# Patient Record
Sex: Female | Born: 1975 | Race: Black or African American | Hispanic: No | Marital: Single | Smoking: Current some day smoker
Health system: Southern US, Community
[De-identification: ages and names within clinical notes are randomized; demographics above are authoritative.]

## PROBLEM LIST (undated history)

## (undated) DIAGNOSIS — T7421XA Adult sexual abuse, confirmed, initial encounter: Secondary | ICD-10-CM

## (undated) HISTORY — DX: Adult sexual abuse, confirmed, initial encounter: T74.21XA

## (undated) HISTORY — PX: MOUTH SURGERY: SHX715

## (undated) HISTORY — PX: COSMETIC SURGERY: SHX468

---

## 2013-06-01 ENCOUNTER — Emergency Department (HOSPITAL_COMMUNITY): Payer: Self-pay

## 2013-06-01 ENCOUNTER — Emergency Department (HOSPITAL_COMMUNITY)
Admission: EM | Admit: 2013-06-01 | Discharge: 2013-06-01 | Disposition: A | Discharge status: Home / Self Care | Payer: Self-pay | Attending: Emergency Medicine | Admitting: Emergency Medicine

## 2013-06-01 ENCOUNTER — Encounter (HOSPITAL_COMMUNITY): Payer: Self-pay | Admitting: *Deleted

## 2013-06-01 DIAGNOSIS — F17200 Nicotine dependence, unspecified, uncomplicated: Secondary | ICD-10-CM | POA: Insufficient documentation

## 2013-06-01 DIAGNOSIS — K047 Periapical abscess without sinus: Secondary | ICD-10-CM | POA: Insufficient documentation

## 2013-06-01 LAB — BASIC METABOLIC PANEL
CO2: 21 mEq/L (ref 19–32)
Calcium: 9 mg/dL (ref 8.4–10.5)
GFR calc Af Amer: >90 mL/min (ref >90)
Sodium: 134 mEq/L — ABNORMAL LOW (ref 135–145)

## 2013-06-01 LAB — CBC WITH DIFFERENTIAL/PLATELET
Basophils Absolute: 0 10*3/uL (ref 0.0–0.1)
Eosinophils Relative: 1 % (ref 0–5)
Lymphocytes Relative: 42 % (ref 12–46)
MCV: 83.3 fL (ref 78.0–100.0)
Neutro Abs: 4.1 10*3/uL (ref 1.7–7.7)
Platelets: 342 10*3/uL (ref 150–400)
RDW: 14.6 % (ref 11.5–15.5)
WBC: 8.4 10*3/uL (ref 4.0–10.5)

## 2013-06-01 MED ORDER — OXYCODONE-ACETAMINOPHEN 5-325 MG PO TABS: 2.0000 | ORAL_TABLET | Freq: Four times a day (QID) | ORAL | Status: DC | PRN

## 2013-06-01 MED ORDER — MORPHINE SULFATE 4 MG/ML IJ SOLN
4.0000 mg | Freq: Once | INTRAMUSCULAR | Status: AC
  Administered 2013-06-01: 4 mg via INTRAVENOUS
  Filled 2013-06-01: qty 1

## 2013-06-01 MED ORDER — AMOXICILLIN 500 MG PO CAPS: 500.0000 mg | ORAL_CAPSULE | Freq: Three times a day (TID) | ORAL | Status: DC

## 2013-06-01 MED ORDER — IOHEXOL 300 MG/ML  SOLN
75.0000 mL | Freq: Once | INTRAMUSCULAR | Status: AC | PRN
  Administered 2013-06-01: 75 mL via INTRAVENOUS

## 2013-06-01 MED ORDER — CLINDAMYCIN HCL 300 MG PO CAPS
300.0000 mg | ORAL_CAPSULE | Freq: Once | ORAL | Status: AC
  Administered 2013-06-01: 300 mg via ORAL
  Filled 2013-06-01: qty 1

## 2013-06-01 NOTE — ED Notes (Signed)
Pt states pain controlled

## 2013-06-01 NOTE — ED Notes (Signed)
Patient states right sided facial swelling x 2 days, pt states started as tooth ache

## 2013-06-01 NOTE — ED Notes (Signed)
Roxy Horseman, PA and Dr Rulon Abide at bedside

## 2013-06-01 NOTE — ED Notes (Signed)
PATIENT HAS ARRIVED TO WAIT FOR ORAL SURGEON

## 2013-06-01 NOTE — ED Provider Notes (Addendum)
History    CSN: [id number] Arrival date & time 06/01/13  0615  First MD Initiated Contact with Patient 06/01/13 [phone]     Chief Complaint  Patient presents with  . Oral Swelling   (Consider location/radiation/quality/duration/timing/severity/associated sxs/prior Treatment) HPI Comments: Patient presents emergency department with chief complaint of jaw pain and right-sided facial swelling. States that she is toothache for the past 2 weeks, but really noticed the jaw swelling yesterday. She is tried taking Aleve with no relief. She denies fevers, chills, nausea, vomiting. She denies any difficulty breathing, or difficulty swallowing. Nothing makes the symptoms better or worse. She does not have a dentist.  The history is provided by the patient. No language interpreter was used.   History reviewed. No pertinent past medical history. History reviewed. No pertinent past surgical history. No family history on file. History  Substance Use Topics  . Smoking status: Current Every Day Smoker  . Smokeless tobacco: Not on file  . Alcohol Use: No   OB History   Grav Para Term Preterm Abortions TAB SAB Ect Mult Living                 Review of Systems  All other systems reviewed and are negative.    Allergies  Review of patient's allergies indicates not on file.  Home Medications  No current outpatient prescriptions on file. BP 130/83  Temp(Src) 98.1 F (36.7 C) (Oral)  Resp 18  SpO2 100%  LMP 05/03/2013 Physical Exam  Nursing note and vitals reviewed. Constitutional: She is oriented to person, place, and time. She appears well-developed and well-nourished.  HENT:  Head: Normocephalic and atraumatic.  Mouth/Throat:    Poor dentition throughout.  Affected tooth as diagrammed.  No signs of peritonsillar or tonsillar abscess.  Oropharynx is clear and without exudates.  Uvula is midline.  Airway is intact. Question deep tissue swelling.  Eyes: Conjunctivae and EOM are normal.  Pupils are equal, round, and reactive to light.  Neck: Normal range of motion. Neck supple.  Cardiovascular: Normal rate and regular rhythm.  Exam reveals no gallop and no friction rub.   No murmur heard. Pulmonary/Chest: Effort normal and breath sounds normal. No respiratory distress. She has no wheezes. She has no rales. She exhibits no tenderness.  Abdominal: Soft. Bowel sounds are normal. She exhibits no distension and no mass. There is no tenderness. There is no rebound and no guarding.  Musculoskeletal: Normal range of motion. She exhibits no edema and no tenderness.  Neurological: She is alert and oriented to person, place, and time.  Skin: Skin is warm and dry.  Psychiatric: She has a normal mood and affect. Her behavior is normal. Judgment and thought content normal.    ED Course  Procedures (including critical care time) Labs Reviewed - No data to display Results for orders placed during the hospital encounter of 06/01/13  CBC WITH DIFFERENTIAL      Result Value Range   WBC 8.4  4.0 - 10.5 K/uL   RBC 4.12  3.87 - 5.11 MIL/uL   Hemoglobin 11.3 (*) 12.0 - 15.0 g/dL   HCT 57.8 (*) 46.9 - 62.9 %   MCV 83.3  78.0 - 100.0 fL   MCH 27.4  26.0 - 34.0 pg   MCHC 32.9  30.0 - 36.0 g/dL   RDW 52.8  41.3 - 24.4 %   Platelets 342  150 - 400 K/uL   Neutrophils Relative % 49  43 - 77 %   Neutro  Abs 4.1  1.7 - 7.7 K/uL   Lymphocytes Relative 42  12 - 46 %   Lymphs Abs 3.5  0.7 - 4.0 K/uL   Monocytes Relative 7  3 - 12 %   Monocytes Absolute 0.6  0.1 - 1.0 K/uL   Eosinophils Relative 1  0 - 5 %   Eosinophils Absolute 0.1  0.0 - 0.7 K/uL   Basophils Relative 0  0 - 1 %   Basophils Absolute 0.0  0.0 - 0.1 K/uL  BASIC METABOLIC PANEL      Result Value Range   Sodium 134 (*) 135 - 145 mEq/L   Potassium 3.9  3.5 - 5.1 mEq/L   Chloride 102  96 - 112 mEq/L   CO2 21  19 - 32 mEq/L   Glucose, Bld 106 (*) 70 - 99 mg/dL   BUN 11  6 - 23 mg/dL   Creatinine, Ser 1.61  0.50 - 1.10 mg/dL    Calcium 9.0  8.4 - 09.6 mg/dL   GFR calc non Af Amer >90  >90 mL/min   GFR calc Af Amer >90  >90 mL/min   Ct Soft Tissue Neck W Contrast  06/01/2013   *RADIOLOGY REPORT*  Clinical Data: Soft tissue swelling right maxilla and right submandibular region.  Toothache.  CT NECK WITH CONTRAST  Technique:  Multidetector CT imaging of the neck was performed with intravenous contrast.  Contrast: 75mL OMNIPAQUE IOHEXOL 300 MG/ML  SOLN  Comparison: None.  Findings: There is soft tissue swelling and infiltration of fat along the right body of the mandible. A small fluid collection is seen along the right side of the mandible measuring 0.9 cm AP by 0.3 cm transverse on image 62.  There is prominent periapical lucency and a small focus of cortical disruption in the periphery of the mandible about tooth number 29. A smaller periapical lucency is seen about tooth number 30.  Prominent periapical lucency is also seen about tooth number 17 on the left.  The patient has multiple cavities.  Major caliber vascular structures are patent.  No pathologic lymphadenopathy by CT size criteria is identified.  Imaged intracranial contents appear normal.  The lung apices are clear.  IMPRESSION:  1.   Peri apical abscesses about teeth number 29 and 30.  Changes appear worst about tooth number 29 where a small subperiosteal abscess is identified.  Associated cellulitis about the right side of the mandible is noted. 2.  Extensive dental disease.  Large periapical abscess is identified about tooth number 17.   Original Report Authenticated By: Holley Dexter, M.D.   INCISION AND DRAINAGE Performed by: Roxy Horseman Consent: Verbal consent obtained. Risks and benefits: risks, benefits and alternatives were discussed Type: abscess  Body area: gingiva  Anesthesia: local infiltration  Incision was made with a scalpel.  Local anesthetic: Bupivacaine  Anesthetic total: 2 ml  Complexity: complex Blunt dissection to break up  loculations  Drainage: purulent  Drainage amount: moderate  Packing material: not packed  Patient tolerance: Patient tolerated the procedure well with no immediate complications.     1. Dental abscess     MDM    6:42 AM Patient seen by and discussed with Dr. Rulon Abide.  Plan: CT maxillofacial and neck, based on the rapid nature of the infection, basic labs, clindamycin, I&D, and oral surgery consult.  9:23 AM Dr. Chales Salmon will be in surgery for several hours, and was unable to take my call.  CT shows multiple abscesses, but the patient's  airway is intact, and she does not have any evidence of Ludwig's.  Will move patient to pod c, and will wait for Dr. Chales Salmon to return my phone call.    12:03 PM Spoke with Dr. Chales Salmon via his nurse.  He recommends discharge with amoxicillin and pain control.  His office will see the patient tomorrow.  No airway compromise. No difficulty swallowing.  Patient is stable and ready for discharge.  Roxy Horseman, PA-C 06/01/13 1205  Roxy Horseman, PA-C 06/03/13 [phone]

## 2013-06-02 NOTE — ED Provider Notes (Signed)
Medical screening examination/treatment/procedure(s) were performed by non-physician practitioner and as supervising physician I was immediately available for consultation/collaboration.  Jones Skene, M.D.     Jones Skene, MD 06/02/13 [phone]

## 2013-06-04 NOTE — ED Provider Notes (Signed)
Medical screening examination/treatment/procedure(s) were performed by non-physician practitioner and as supervising physician I was immediately available for consultation/collaboration.  Jones Skene, M.D.     Jones Skene, MD 06/04/13 1610

## 2013-09-26 ENCOUNTER — Encounter (HOSPITAL_COMMUNITY): Payer: Self-pay | Admitting: Emergency Medicine

## 2013-09-26 ENCOUNTER — Emergency Department (HOSPITAL_COMMUNITY)
Admission: EM | Admit: 2013-09-26 | Discharge: 2013-09-26 | Discharge status: Against Medical Advice | Payer: Self-pay | Attending: Emergency Medicine | Admitting: Emergency Medicine

## 2013-09-26 DIAGNOSIS — R110 Nausea: Secondary | ICD-10-CM | POA: Insufficient documentation

## 2013-09-26 DIAGNOSIS — R109 Unspecified abdominal pain: Secondary | ICD-10-CM | POA: Insufficient documentation

## 2013-09-26 DIAGNOSIS — N6459 Other signs and symptoms in breast: Secondary | ICD-10-CM | POA: Insufficient documentation

## 2013-09-26 DIAGNOSIS — Z3202 Encounter for pregnancy test, result negative: Secondary | ICD-10-CM | POA: Insufficient documentation

## 2013-09-26 DIAGNOSIS — F17200 Nicotine dependence, unspecified, uncomplicated: Secondary | ICD-10-CM | POA: Insufficient documentation

## 2013-09-26 LAB — POCT PREGNANCY, URINE: Preg Test, Ur: NEGATIVE

## 2013-09-26 LAB — URINALYSIS, ROUTINE W REFLEX MICROSCOPIC
Glucose, UA: NEGATIVE mg/dL
Nitrite: NEGATIVE
Protein, ur: NEGATIVE mg/dL
Urobilinogen, UA: 1 mg/dL (ref 0.0–1.0)

## 2013-09-26 LAB — URINE MICROSCOPIC-ADD ON

## 2013-09-26 NOTE — ED Notes (Signed)
Called for pt with no answer 

## 2013-09-26 NOTE — ED Notes (Signed)
Pt called for with no answer x 1

## 2013-09-26 NOTE — ED Notes (Signed)
Called for pt with no answer x 2 

## 2013-09-26 NOTE — ED Notes (Signed)
Pt c/o lower abd pain with LMP beginning of October; pt sts white discharge from breasts and sts nausea

## 2013-09-29 ENCOUNTER — Encounter (HOSPITAL_COMMUNITY): Payer: Self-pay | Admitting: Emergency Medicine

## 2013-09-29 ENCOUNTER — Emergency Department (HOSPITAL_COMMUNITY)
Admission: EM | Admit: 2013-09-29 | Discharge: 2013-09-29 | Disposition: A | Discharge status: Home / Self Care | Payer: Self-pay | Attending: Emergency Medicine | Admitting: Emergency Medicine

## 2013-09-29 DIAGNOSIS — F17200 Nicotine dependence, unspecified, uncomplicated: Secondary | ICD-10-CM | POA: Insufficient documentation

## 2013-09-29 DIAGNOSIS — B9689 Other specified bacterial agents as the cause of diseases classified elsewhere: Secondary | ICD-10-CM | POA: Insufficient documentation

## 2013-09-29 DIAGNOSIS — N760 Acute vaginitis: Secondary | ICD-10-CM | POA: Insufficient documentation

## 2013-09-29 DIAGNOSIS — A499 Bacterial infection, unspecified: Secondary | ICD-10-CM | POA: Insufficient documentation

## 2013-09-29 DIAGNOSIS — N6459 Other signs and symptoms in breast: Secondary | ICD-10-CM | POA: Insufficient documentation

## 2013-09-29 DIAGNOSIS — Z3202 Encounter for pregnancy test, result negative: Secondary | ICD-10-CM | POA: Insufficient documentation

## 2013-09-29 LAB — COMPREHENSIVE METABOLIC PANEL
Albumin: 3.7 g/dL (ref 3.5–5.2)
BUN: 9 mg/dL (ref 6–23)
Creatinine, Ser: 0.87 mg/dL (ref 0.50–1.10)
GFR calc Af Amer: >90 mL/min (ref >90)
Glucose, Bld: 91 mg/dL (ref 70–99)
Total Bilirubin: 0.2 mg/dL — ABNORMAL LOW (ref 0.3–1.2)
Total Protein: 6.7 g/dL (ref 6.0–8.3)

## 2013-09-29 LAB — CBC WITH DIFFERENTIAL/PLATELET
Basophils Relative: 0 % (ref 0–1)
Eosinophils Absolute: 0.1 10*3/uL (ref 0.0–0.7)
Eosinophils Relative: 2 % (ref 0–5)
HCT: 38.9 % (ref 36.0–46.0)
Hemoglobin: 12.9 g/dL (ref 12.0–15.0)
Lymphs Abs: 2.7 10*3/uL (ref 0.7–4.0)
MCH: 28.6 pg (ref 26.0–34.0)
MCHC: 33.2 g/dL (ref 30.0–36.0)
MCV: 86.3 fL (ref 78.0–100.0)
Monocytes Absolute: 0.6 10*3/uL (ref 0.1–1.0)
Monocytes Relative: 9 % (ref 3–12)

## 2013-09-29 LAB — POCT PREGNANCY, URINE: Preg Test, Ur: NEGATIVE

## 2013-09-29 LAB — URINE MICROSCOPIC-ADD ON

## 2013-09-29 LAB — URINALYSIS, ROUTINE W REFLEX MICROSCOPIC
Glucose, UA: NEGATIVE mg/dL
Ketones, ur: 15 mg/dL — AB
Nitrite: NEGATIVE
Protein, ur: NEGATIVE mg/dL
pH: 7 (ref 5.0–8.0)

## 2013-09-29 LAB — WET PREP, GENITAL

## 2013-09-29 MED ORDER — METRONIDAZOLE 500 MG PO TABS: 500.0000 mg | ORAL_TABLET | Freq: Two times a day (BID) | ORAL | Status: DC

## 2013-09-29 NOTE — ED Notes (Signed)
Walked in to room to discharge patient. Pt. Was not in room or restroom.

## 2013-09-29 NOTE — ED Notes (Signed)
Pt. Left prior to receiving discharge paperwork.

## 2013-09-29 NOTE — ED Notes (Signed)
Pt reports lower abd pain and cramping x 1.5 week. Having nausea, denies any urinary or vaginal symptoms. Denies vomiting or diarrhea. Did have irregular period last month that only lasted approx 1 day. Also having a discharge from right breast.

## 2013-09-29 NOTE — ED Provider Notes (Signed)
CSN: [id number]     Arrival date & time 09/29/13  1650 History   First MD Initiated Contact with Patient 09/29/13 1959     Chief Complaint  Patient presents with  . Abdominal Pain  . Breast Discharge   (Consider location/radiation/quality/duration/timing/severity/associated sxs/prior Treatment) Patient is a 37 y.o. female presenting with abdominal pain. The history is provided by the patient.  Abdominal Pain  patient complains of bilateral ovarian pain x1.5 weeks. Pain characterized as sharp and lasting for seconds. No associated vaginal bleeding or discharge. Has had some clear discharge from her right nipple without surrounding redness or drainage. No dysuria or hematuria. Denies any fever or chills. Nothing makes her symptoms better worse and no treatment used prior to arrival. Last menstrual period Was in October  History reviewed. No pertinent past medical history. History reviewed. No pertinent past surgical history. History reviewed. No pertinent family history. History  Substance Use Topics  . Smoking status: Current Every Day Smoker  . Smokeless tobacco: Not on file  . Alcohol Use: No   OB History   Grav Para Term Preterm Abortions TAB SAB Ect Mult Living                 Review of Systems  Gastrointestinal: Positive for abdominal pain.  All other systems reviewed and are negative.    Allergies  Review of patient's allergies indicates no known allergies.  Home Medications   Current Outpatient Rx  Name  Route  Sig  Dispense  Refill  . acetaminophen (TYLENOL) 325 MG suppository   Rectal   Place 325 mg rectally every 4 (four) hours as needed for fever.         . naproxen sodium (ANAPROX) 220 MG tablet   Oral   Take 220 mg by mouth 2 (two) times daily as needed.          BP 122/64  Pulse 77  Temp(Src) 98.4 F (36.9 C) (Oral)  Resp 18  Ht 5\' 7"  (1.702 m)  Wt 217 lb 11.2 oz (98.748 kg)  BMI 34.09 kg/m2  SpO2 99%  LMP 08/10/2013 Physical Exam  Nursing  note and vitals reviewed. Constitutional: She is oriented to person, place, and time. She appears well-developed and well-nourished.  Non-toxic appearance. No distress.  HENT:  Head: Normocephalic and atraumatic.  Eyes: Conjunctivae, EOM and lids are normal. Pupils are equal, round, and reactive to light.  Neck: Normal range of motion. Neck supple. No tracheal deviation present. No mass present.  Cardiovascular: Normal rate, regular rhythm and normal heart sounds.  Exam reveals no gallop.   No murmur heard. Pulmonary/Chest: Effort normal and breath sounds normal. No stridor. No respiratory distress. She has no decreased breath sounds. She has no wheezes. She has no rhonchi. She has no rales.  Abdominal: Soft. Normal appearance and bowel sounds are normal. She exhibits no distension. There is no tenderness. There is no rigidity, no rebound, no guarding and no CVA tenderness.  Genitourinary: No tenderness or bleeding around the vagina. No vaginal discharge found.  Musculoskeletal: Normal range of motion. She exhibits no edema and no tenderness.  Neurological: She is alert and oriented to person, place, and time. She has normal strength. No cranial nerve deficit or sensory deficit. GCS eye subscore is 4. GCS verbal subscore is 5. GCS motor subscore is 6.  Skin: Skin is warm and dry. No abrasion and no rash noted.  Psychiatric: She has a normal mood and affect. Her speech is normal and behavior  is normal.    ED Course  Procedures (including critical care time) Labs Review Labs Reviewed  COMPREHENSIVE METABOLIC PANEL - Abnormal; Notable for the following:    Alkaline Phosphatase 35 (*)    Total Bilirubin 0.2 (*)    GFR calc non Af Amer 85 (*)    All other components within normal limits  URINALYSIS, ROUTINE W REFLEX MICROSCOPIC - Abnormal; Notable for the following:    Ketones, ur 15 (*)    Leukocytes, UA MODERATE (*)    All other components within normal limits  URINE MICROSCOPIC-ADD ON -  Abnormal; Notable for the following:    Squamous Epithelial / LPF FEW (*)    Bacteria, UA FEW (*)    All other components within normal limits  URINE CULTURE  CBC WITH DIFFERENTIAL  POCT PREGNANCY, URINE   Imaging Review No results found.  EKG Interpretation   None       MDM  No diagnosis found. Patient to be treated for bacterial vaginosis    Toy Baker, MD 09/29/13 2250

## 2013-09-30 LAB — GC/CHLAMYDIA PROBE AMP: GC Probe RNA: NEGATIVE

## 2013-10-01 LAB — URINE CULTURE: Colony Count: >=100000

## 2013-10-02 ENCOUNTER — Telehealth (HOSPITAL_COMMUNITY): Payer: Self-pay | Admitting: Emergency Medicine

## 2013-10-02 NOTE — ED Notes (Signed)
Post ED Visit - Positive Culture Follow-up: Successful Patient Follow-Up  Culture assessed and recommendations reviewed by: []  Wes Dulaney, Pharm.D., BCPS [x]  Celedonio Miyamoto, Pharm.D., BCPS []  Georgina Pillion, [address].D., BCPS []  [place], [address].D., BCPS, AAHIVP []  Estella Husk, Pharm.D., BCPS, AAHIVP  Positive urine culture  [x]  Patient discharged without antimicrobial prescription and treatment is now indicated []  Organism is resistant to prescribed ED discharge antimicrobial []  Patient with positive blood cultures  Changes discussed with ED provider: Jaynie Crumble PA-C New antibiotic prescription: If symptomatic, Keflex 500 mg TID for 7 days    Kylie A Holland 10/02/2013, 1:57 PM

## 2013-10-15 ENCOUNTER — Encounter (HOSPITAL_COMMUNITY): Payer: Self-pay | Admitting: Emergency Medicine

## 2013-10-15 ENCOUNTER — Emergency Department (HOSPITAL_COMMUNITY)
Admission: EM | Admit: 2013-10-15 | Discharge: 2013-10-15 | Disposition: A | Discharge status: Home / Self Care | Payer: Self-pay | Attending: Emergency Medicine | Admitting: Emergency Medicine

## 2013-10-15 DIAGNOSIS — Z79899 Other long term (current) drug therapy: Secondary | ICD-10-CM | POA: Insufficient documentation

## 2013-10-15 DIAGNOSIS — Z8619 Personal history of other infectious and parasitic diseases: Secondary | ICD-10-CM | POA: Insufficient documentation

## 2013-10-15 DIAGNOSIS — Z3202 Encounter for pregnancy test, result negative: Secondary | ICD-10-CM | POA: Insufficient documentation

## 2013-10-15 DIAGNOSIS — N730 Acute parametritis and pelvic cellulitis: Secondary | ICD-10-CM

## 2013-10-15 DIAGNOSIS — R110 Nausea: Secondary | ICD-10-CM | POA: Insufficient documentation

## 2013-10-15 DIAGNOSIS — N390 Urinary tract infection, site not specified: Secondary | ICD-10-CM | POA: Insufficient documentation

## 2013-10-15 DIAGNOSIS — N949 Unspecified condition associated with female genital organs and menstrual cycle: Secondary | ICD-10-CM | POA: Insufficient documentation

## 2013-10-15 DIAGNOSIS — F17200 Nicotine dependence, unspecified, uncomplicated: Secondary | ICD-10-CM | POA: Insufficient documentation

## 2013-10-15 DIAGNOSIS — N739 Female pelvic inflammatory disease, unspecified: Secondary | ICD-10-CM | POA: Insufficient documentation

## 2013-10-15 LAB — URINALYSIS, ROUTINE W REFLEX MICROSCOPIC
Nitrite: NEGATIVE
Specific Gravity, Urine: 1.024 (ref 1.005–1.030)
Urobilinogen, UA: 0.2 mg/dL (ref 0.0–1.0)
pH: 5 (ref 5.0–8.0)

## 2013-10-15 LAB — PREGNANCY, URINE: Preg Test, Ur: NEGATIVE

## 2013-10-15 LAB — WET PREP, GENITAL
Clue Cells Wet Prep HPF POC: NONE SEEN
Trich, Wet Prep: NONE SEEN
WBC, Wet Prep HPF POC: NONE SEEN
Yeast Wet Prep HPF POC: NONE SEEN

## 2013-10-15 MED ORDER — LIDOCAINE HCL (PF) 1 % IJ SOLN
INTRAMUSCULAR | Status: AC
  Administered 2013-10-15: 2 mL
  Filled 2013-10-15: qty 5

## 2013-10-15 MED ORDER — DOXYCYCLINE HYCLATE 100 MG PO CAPS: 100.0000 mg | ORAL_CAPSULE | Freq: Two times a day (BID) | ORAL | Status: DC

## 2013-10-15 MED ORDER — ONDANSETRON 4 MG PO TBDP
4.0000 mg | ORAL_TABLET | Freq: Once | ORAL | Status: AC
  Administered 2013-10-15: 4 mg via ORAL
  Filled 2013-10-15: qty 1

## 2013-10-15 MED ORDER — IBUPROFEN 800 MG PO TABS
800.0000 mg | ORAL_TABLET | Freq: Once | ORAL | Status: AC
  Administered 2013-10-15: 800 mg via ORAL
  Filled 2013-10-15: qty 1

## 2013-10-15 MED ORDER — CEPHALEXIN 500 MG PO CAPS: 500.0000 mg | ORAL_CAPSULE | Freq: Four times a day (QID) | ORAL | Status: DC

## 2013-10-15 MED ORDER — CEFTRIAXONE SODIUM 250 MG IJ SOLR
250.0000 mg | Freq: Once | INTRAMUSCULAR | Status: AC
  Administered 2013-10-15: 250 mg via INTRAMUSCULAR
  Filled 2013-10-15: qty 250

## 2013-10-15 MED ORDER — AZITHROMYCIN 250 MG PO TABS
1000.0000 mg | ORAL_TABLET | Freq: Once | ORAL | Status: AC
  Administered 2013-10-15: 1000 mg via ORAL
  Filled 2013-10-15: qty 4

## 2013-10-15 MED ORDER — LIDOCAINE HCL (PF) 1 % IJ SOLN
2.0000 mL | Freq: Once | INTRAMUSCULAR | Status: AC
  Administered 2013-10-15: 2 mL

## 2013-10-15 MED ORDER — HYDROCODONE-ACETAMINOPHEN 5-325 MG PO TABS
2.0000 | ORAL_TABLET | Freq: Once | ORAL | Status: AC
  Administered 2013-10-15: 2 via ORAL
  Filled 2013-10-15: qty 2

## 2013-10-15 NOTE — ED Notes (Signed)
Pt. Stated, Courtney Welch been having lower Abdominal pain for about 2 days . When I urinate its painful afterwards. Ive had Pelvic Inflammatory disease before.

## 2013-10-15 NOTE — ED Provider Notes (Signed)
CSN: [id number]     Arrival date & time 10/15/13  1130 History   First MD Initiated Contact with Patient 10/15/13 1144     Chief Complaint  Patient presents with  . Abdominal Pain   (Consider location/radiation/quality/duration/timing/severity/associated sxs/prior Treatment) HPI Pt is a 37yo female with hx of PID 37yo ago c/o lower abdominal pain that started 2-3 days ago. Pain is cramping and sharp, 8/10 at worst. State it feels like a pulling sensation when she lies flat.  Also c/o burning at the end of urination, and nausea.  State it feels like last time she had PID. Has not taken anything for pain at home. Denies fever, vomiting, or diarrhea. Denies hematuria. Pt does not have a GYN. Pt is sexually active, not on birth control.   History reviewed. No pertinent past medical history. History reviewed. No pertinent past surgical history. No family history on file. History  Substance Use Topics  . Smoking status: Current Every Day Smoker  . Smokeless tobacco: Not on file  . Alcohol Use: No   OB History   Grav Para Term Preterm Abortions TAB SAB Ect Mult Living                 Review of Systems  Constitutional: Negative for fever and chills.  Gastrointestinal: Positive for nausea and abdominal pain. Negative for vomiting and diarrhea.  Genitourinary: Positive for dysuria, vaginal discharge and pelvic pain. Negative for urgency, frequency, hematuria, flank pain, decreased urine volume, vaginal bleeding and genital sores.  All other systems reviewed and are negative.    Allergies  Review of patient's allergies indicates no known allergies.  Home Medications   Current Outpatient Rx  Name  Route  Sig  Dispense  Refill  . acetaminophen (TYLENOL) 325 MG suppository   Rectal   Place 325 mg rectally every 4 (four) hours as needed for fever.         Marland Kitchen acetaminophen (TYLENOL) 500 MG tablet   Oral   Take 1,000 mg by mouth every 6 (six) hours as needed.         . naproxen  sodium (ANAPROX) 220 MG tablet   Oral   Take 220 mg by mouth 2 (two) times daily as needed.         . cephALEXin (KEFLEX) 500 MG capsule   Oral   Take 1 capsule (500 mg total) by mouth 4 (four) times daily.   40 capsule   0   . doxycycline (VIBRAMYCIN) 100 MG capsule   Oral   Take 1 capsule (100 mg total) by mouth 2 (two) times daily.   20 capsule   0   . metroNIDAZOLE (FLAGYL) 500 MG tablet   Oral   Take 1 tablet (500 mg total) by mouth 2 (two) times daily.   14 tablet   0    BP 114/71  Pulse 94  Temp(Src) 98.1 F (36.7 C) (Oral)  Resp 16  Ht 5\' 7"  (1.702 m)  Wt 214 lb (97.07 kg)  BMI 33.51 kg/m2  SpO2 100%  LMP 09/14/2013 Physical Exam  Nursing note and vitals reviewed. Constitutional: She appears well-developed and well-nourished.  Pt lying on right side in fetal position, appears uncomfortable.  HENT:  Head: Normocephalic and atraumatic.  Eyes: Conjunctivae are normal. No scleral icterus.  Neck: Normal range of motion.  Cardiovascular: Normal rate, regular rhythm and normal heart sounds.   Pulmonary/Chest: Effort normal and breath sounds normal. No respiratory distress. She has no wheezes. She  has no rales. She exhibits no tenderness.  Abdominal: Soft. Bowel sounds are normal. She exhibits no distension and no mass. There is tenderness ( moderate tenderness, lower abdomen). There is no rebound and no guarding. Hernia confirmed negative in the right inguinal area and confirmed negative in the left inguinal area.  Genitourinary: Uterus normal. No labial fusion. There is no rash, tenderness, lesion or injury on the right labia. There is no rash, tenderness, lesion or injury on the left labia. Cervix exhibits motion tenderness and discharge ( scant white discharge). Cervix exhibits no friability. Right adnexum displays tenderness. Right adnexum displays no mass and no fullness. Left adnexum displays tenderness. Left adnexum displays no mass and no fullness. There is  tenderness around the vagina. No erythema or bleeding around the vagina. No foreign body around the vagina. No signs of injury around the vagina. No vaginal discharge found.  Musculoskeletal: Normal range of motion.  Lymphadenopathy:       Right: No inguinal adenopathy present.       Left: No inguinal adenopathy present.  Neurological: She is alert.  Skin: Skin is warm and dry.    ED Course  Procedures (including critical care time) Labs Review Labs Reviewed  URINALYSIS, ROUTINE W REFLEX MICROSCOPIC - Abnormal; Notable for the following:    APPearance CLOUDY (*)    Hgb urine dipstick SMALL (*)    Bilirubin Urine SMALL (*)    Ketones, ur 15 (*)    Leukocytes, UA LARGE (*)    All other components within normal limits  WET PREP, GENITAL  GC/CHLAMYDIA PROBE AMP  URINE MICROSCOPIC-ADD ON  PREGNANCY, URINE   Imaging Review No results found.  EKG Interpretation   None       MDM   1. PID (acute pelvic inflammatory disease)   2. UTI (lower urinary tract infection)    Pt with hx of PID 77yr ago c/o similar symptoms that started 2 days ago: lower abdominal pain, nausea, dysuria. Denies fever, vomiting or diarrhea. Pt afebrile in ED. Mild tachycardia- 113 in triage.  Likely due to pain as pt appears uncomfortable. Will perform pelvic exam, urine preg and UA.  Will tx empirically for PID. Tx in ED: azithromycin, rocephin, vicodin, ibuprofen, zofran.     Urine preg: negative UA: large leukocyts, 21-50 WBC, no nitrites, however due to pt being symptomatic, c/o dysuria will tx.  Pelvic exam: CMT and bilateral adnexal tenderness w/o masses. Scant white discharge.  Wet Prep: normal  Rx: doxycycline for PID and keflex for possible UTI, tramadol.  Provided pt info on [facility]. Advised pt to f/u with GYN for further evaluation and tx of pelvic pain. Encouraged pt to have all sexual partners tested and tx for STIs.  Pt verbalized understanding and agreement with tx  plan.    Junius Finner, PA-C 10/15/13 1342

## 2013-10-15 NOTE — ED Notes (Signed)
PT comfortable with d/c and f/u instructions. Prescriptions x2 

## 2013-10-17 LAB — GC/CHLAMYDIA PROBE AMP
CT Probe RNA: NEGATIVE
GC Probe RNA: NEGATIVE

## 2013-10-17 NOTE — ED Provider Notes (Signed)
Medical screening examination/treatment/procedure(s) were performed by non-physician practitioner and as supervising physician I was immediately available for consultation/collaboration.  EKG Interpretation   None         Shelda Jakes, MD 10/17/13 [phone]

## 2013-11-10 NOTE — ED Notes (Signed)
No response after 30 days  chart appended and sent to Medical Records 

## 2013-11-14 ENCOUNTER — Encounter: Payer: Self-pay | Admitting: Nurse Practitioner

## 2013-11-14 ENCOUNTER — Ambulatory Visit (INDEPENDENT_AMBULATORY_CARE_PROVIDER_SITE_OTHER): Payer: Self-pay | Admitting: Nurse Practitioner

## 2013-11-14 VITALS — BP 128/80 | HR 80 | Ht 67.0 in | Wt 205.3 lb

## 2013-11-14 DIAGNOSIS — N912 Amenorrhea, unspecified: Secondary | ICD-10-CM

## 2013-11-14 DIAGNOSIS — N390 Urinary tract infection, site not specified: Secondary | ICD-10-CM | POA: Insufficient documentation

## 2013-11-14 LAB — POCT URINALYSIS DIP (DEVICE)
BILIRUBIN URINE: NEGATIVE
GLUCOSE, UA: NEGATIVE mg/dL
KETONES UR: 40 mg/dL — AB
Leukocytes, UA: NEGATIVE
Nitrite: NEGATIVE
PH: 7 (ref 5.0–8.0)
Protein, ur: NEGATIVE mg/dL
SPECIFIC GRAVITY, URINE: 1.015 (ref 1.005–1.030)
Urobilinogen, UA: 0.2 mg/dL (ref 0.0–1.0)

## 2013-11-14 LAB — POCT PREGNANCY, URINE: Preg Test, Ur: NEGATIVE

## 2013-11-14 MED ORDER — CEPHALEXIN 500 MG PO CAPS: 500.0000 mg | ORAL_CAPSULE | Freq: Three times a day (TID) | ORAL | Status: DC

## 2013-11-14 NOTE — Progress Notes (Signed)
History:  Courtney Welch a 38 y.o. G1P0010 [facility]. She was seen in ER one month ago, diagnosed with UTI, and given an antibiotic that she did not fill. Her pain has not gotten better. She states she can not get the prescription fill due to expense. Advised it was a $4 prescription and less than a package of cigarettes. She admits all she drinks is soda and juice. States she is not on birth control but is seeking pregnancy. All of her STD tests were WNL. Wet prep was WNL. She did not have PID.  The following portions of the patient's history were reviewed and updated as appropriate: allergies, current medications, past family history, past medical history, past social history, past surgical history and problem list.  Review of Systems:  Pertinent items are noted in HPI.  Objective:  Physical Exam BP 128/80  Pulse 80  Ht 5\' 7"  (1.702 m)  Wt 205 lb 4.8 oz (93.123 kg)  BMI 32.15 kg/m2  LMP 09/14/2013 GENERAL: Well-developed, well-nourished female in no acute distress.  HEENT: Normocephalic, atraumatic.  ABDOMEN: Soft, nontender, nondistended. No organomegaly. Normal bowel sounds appreciated in all quadrants.  EXTREMITIES: No cyanosis, clubbing, or edema, 2+ distal pulses.   Labs and Imaging Results for orders placed in visit on 11/14/13 (from the past 24 hour(s))  POCT URINALYSIS DIP (DEVICE)     Status: Abnormal   Collection Time    11/14/13  3:27 PM      Result Value Range   Glucose, UA NEGATIVE  NEGATIVE mg/dL   Bilirubin Urine NEGATIVE  NEGATIVE   Ketones, ur 40 (*) NEGATIVE mg/dL   Specific Gravity, Urine 1.015  1.005 - 1.030   Hgb urine dipstick TRACE (*) NEGATIVE   pH 7.0  5.0 - 8.0   Protein, ur NEGATIVE  NEGATIVE mg/dL   Urobilinogen, UA 0.2  0.0 - 1.0 mg/dL   Nitrite NEGATIVE  NEGATIVE   Leukocytes, UA NEGATIVE  NEGATIVE  POCT PREGNANCY, URINE     Status: None   Collection Time    11/14/13  3:29 PM      Result  Value Range   Preg Test, Ur NEGATIVE  NEGATIVE     Assessment & Plan:  Assessment:  UTI  Plans:  Rewrite RX for Keflex Advised to increase water and avoid soda and juice If pain does not resolve with antibiotics/ she could return  Delbert PhenixLinda M Tephanie Escorcia, NP 11/14/2013 5:35 PM

## 2013-11-14 NOTE — Progress Notes (Signed)
Patient not feeling better. She wasn't able to afford the prescriptions. Also concerned because she hasn't gotten her cycle. Had one day of bleeding in November, none since that time.

## 2013-11-14 NOTE — Patient Instructions (Signed)
Urinary Tract Infection  Urinary tract infections (UTIs) can develop anywhere along your urinary tract. Your urinary tract is your body's drainage system for removing wastes and extra water. Your urinary tract includes two kidneys, two ureters, a bladder, and a urethra. Your kidneys are a pair of bean-shaped organs. Each kidney is about the size of your fist. They are located below your ribs, one on each side of your spine.  CAUSES  Infections are caused by microbes, which are microscopic organisms, including fungi, viruses, and bacteria. These organisms are so small that they can only be seen through a microscope. Bacteria are the microbes that most commonly cause UTIs.  SYMPTOMS   Symptoms of UTIs may vary by age and gender of the patient and by the location of the infection. Symptoms in young women typically include a frequent and intense urge to urinate and a painful, burning feeling in the bladder or urethra during urination. Older women and men are more likely to be tired, shaky, and weak and have muscle aches and abdominal pain. A fever may mean the infection is in your kidneys. Other symptoms of a kidney infection include pain in your back or sides below the ribs, nausea, and vomiting.  DIAGNOSIS  To diagnose a UTI, your caregiver will ask you about your symptoms. Your caregiver also will ask to provide a urine sample. The urine sample will be tested for bacteria and white blood cells. White blood cells are made by your body to help fight infection.  TREATMENT   Typically, UTIs can be treated with medication. Because most UTIs are caused by a bacterial infection, they usually can be treated with the use of antibiotics. The choice of antibiotic and length of treatment depend on your symptoms and the type of bacteria causing your infection.  HOME CARE INSTRUCTIONS   If you were prescribed antibiotics, take them exactly as your caregiver instructs you. Finish the medication even if you feel better after you  have only taken some of the medication.   Drink enough water and fluids to keep your urine clear or pale yellow.   Avoid caffeine, tea, and carbonated beverages. They tend to irritate your bladder.   Empty your bladder often. Avoid holding urine for long periods of time.   Empty your bladder before and after sexual intercourse.   After a bowel movement, women should cleanse from front to back. Use each tissue only once.  SEEK MEDICAL CARE IF:    You have back pain.   You develop a fever.   Your symptoms do not begin to resolve within 3 days.  SEEK IMMEDIATE MEDICAL CARE IF:    You have severe back pain or lower abdominal pain.   You develop chills.   You have nausea or vomiting.   You have continued burning or discomfort with urination.  MAKE SURE YOU:    Understand these instructions.   Will watch your condition.   Will get help right away if you are not doing well or get worse.  Document Released: 08/06/2005 Document Revised: 04/27/2012 Document Reviewed: 12/05/2011  [employer] Patient Information 2014 [employer].

## 2014-05-04 ENCOUNTER — Ambulatory Visit (INDEPENDENT_AMBULATORY_CARE_PROVIDER_SITE_OTHER): Payer: Self-pay

## 2014-05-04 DIAGNOSIS — Z0000 Encounter for general adult medical examination without abnormal findings: Secondary | ICD-10-CM

## 2014-05-04 DIAGNOSIS — Z3202 Encounter for pregnancy test, result negative: Secondary | ICD-10-CM

## 2014-05-04 DIAGNOSIS — Z3200 Encounter for pregnancy test, result unknown: Secondary | ICD-10-CM

## 2014-05-05 LAB — HCG, QUANTITATIVE, PREGNANCY

## 2014-05-05 LAB — POCT PREGNANCY, URINE: PREG TEST UR: NEGATIVE

## 2014-05-05 NOTE — Progress Notes (Signed)
Pt came in for pregnancy test because she informed me that she has not had a period in 8 months.  Pt stated that she may be pregnant.  Asked Dr. Marice Potterove if we could do a beta for verification she stated "yes, that is fine" along with [facility].  Beta quant drawn and I informed pt that she if she does not here from the clinic then that means she is not pregnant.  I also gave [facility] the [facility] and informed her to call and schedule an appt and that she should make an appt at the clinics for amenorrhea.  Pt agreed.

## 2014-05-25 ENCOUNTER — Inpatient Hospital Stay (HOSPITAL_COMMUNITY): Payer: Self-pay

## 2014-05-25 ENCOUNTER — Inpatient Hospital Stay (HOSPITAL_COMMUNITY)
Admission: AD | Admit: 2014-05-25 | Discharge: 2014-05-25 | Disposition: A | Discharge status: Home / Self Care | Payer: Self-pay | Source: Ambulatory Visit | Attending: Obstetrics and Gynecology | Admitting: Obstetrics and Gynecology

## 2014-05-25 ENCOUNTER — Encounter (HOSPITAL_COMMUNITY): Payer: Self-pay | Admitting: *Deleted

## 2014-05-25 DIAGNOSIS — N949 Unspecified condition associated with female genital organs and menstrual cycle: Secondary | ICD-10-CM | POA: Insufficient documentation

## 2014-05-25 DIAGNOSIS — R1031 Right lower quadrant pain: Secondary | ICD-10-CM | POA: Insufficient documentation

## 2014-05-25 DIAGNOSIS — F17200 Nicotine dependence, unspecified, uncomplicated: Secondary | ICD-10-CM | POA: Insufficient documentation

## 2014-05-25 DIAGNOSIS — R109 Unspecified abdominal pain: Secondary | ICD-10-CM

## 2014-05-25 DIAGNOSIS — N911 Secondary amenorrhea: Secondary | ICD-10-CM

## 2014-05-25 LAB — CBC WITH DIFFERENTIAL/PLATELET
BASOS ABS: 0 10*3/uL (ref 0.0–0.1)
Basophils Relative: 0 % (ref 0–1)
Eosinophils Absolute: 0 10*3/uL (ref 0.0–0.7)
Eosinophils Relative: 0 % (ref 0–5)
HEMATOCRIT: 35.1 % — AB (ref 36.0–46.0)
Hemoglobin: 12 g/dL (ref 12.0–15.0)
LYMPHS ABS: 2 10*3/uL (ref 0.7–4.0)
LYMPHS PCT: 36 % (ref 12–46)
MCH: 30.2 pg (ref 26.0–34.0)
MCHC: 34.2 g/dL (ref 30.0–36.0)
MCV: 88.2 fL (ref 78.0–100.0)
Monocytes Absolute: 0.4 10*3/uL (ref 0.1–1.0)
Monocytes Relative: 7 % (ref 3–12)
Neutro Abs: 3.1 10*3/uL (ref 1.7–7.7)
Neutrophils Relative %: 57 % (ref 43–77)
PLATELETS: 335 10*3/uL (ref 150–400)
RBC: 3.98 MIL/uL (ref 3.87–5.11)
RDW: 13.1 % (ref 11.5–15.5)
WBC: 5.6 10*3/uL (ref 4.0–10.5)

## 2014-05-25 LAB — URINE MICROSCOPIC-ADD ON

## 2014-05-25 LAB — URINALYSIS, ROUTINE W REFLEX MICROSCOPIC
Bilirubin Urine: NEGATIVE
Glucose, UA: NEGATIVE mg/dL
Ketones, ur: NEGATIVE mg/dL
LEUKOCYTES UA: NEGATIVE
Nitrite: NEGATIVE
PH: 6 (ref 5.0–8.0)
Protein, ur: NEGATIVE mg/dL
SPECIFIC GRAVITY, URINE: 1.02 (ref 1.005–1.030)
UROBILINOGEN UA: 0.2 mg/dL (ref 0.0–1.0)

## 2014-05-25 LAB — WET PREP, GENITAL
TRICH WET PREP: NONE SEEN
YEAST WET PREP: NONE SEEN

## 2014-05-25 LAB — POCT PREGNANCY, URINE: Preg Test, Ur: NEGATIVE

## 2014-05-25 NOTE — MAU Note (Signed)
Pt has not had cycle for 4 months  Here a month ago pregnancy test negative. Pt c/o of pain in her ovaries.

## 2014-05-25 NOTE — Discharge Instructions (Signed)
Secondary Amenorrhea  Secondary amenorrhea is the stopping of menstrual flow for 3-6 months in a female who has previously had periods. There are many possible causes. Most of these causes are not serious. Usually, treating the underlying problem causing the loss of menses will return your periods to normal. CAUSES  Some common and uncommon causes of not menstruating include:  Malnutrition.  Low blood sugar (hypoglycemia).  Polycystic ovary disease.  Stress or fear.  Breastfeeding.  Hormone imbalance.  Ovarian failure.  Medicines.  Extreme obesity.  Cystic fibrosis.  Low body weight or drastic weight reduction from any cause.  Early menopause.  Removal of ovaries or uterus.  Contraceptives.  Illness.  Long-term (chronic) illnesses.  Cushing syndrome.  Thyroid problems.  Birth control pills, patches, or vaginal rings for birth control. RISK FACTORS You may be at greater risk of secondary amenorrhea if:  You have a family history of this condition.  You have an eating disorder.  You do athletic training. DIAGNOSIS  A diagnosis is made by your health care provider taking a medical history and doing a physical exam. This will include a pelvic exam to check for problems with your reproductive organs. Pregnancy must be ruled out. Often, numerous blood tests are done to measure different hormones in the body. Urine testing may be done. Specialized exams (ultrasound, CT scan, MRI, or hysteroscopy) may have to be done as well as measuring the body mass index (BMI). TREATMENT  Treatment depends on the cause of the amenorrhea. If an eating disorder is present, this can be treated with an adequate diet and therapy. Chronic illnesses may improve with treatment of the illness. Amenorrhea may be corrected with medicines, lifestyle changes, or surgery. If the amenorrhea cannot be corrected, it is sometimes possible to create a false menstruation with medicines. HOME CARE  INSTRUCTIONS  Maintain a healthy diet.  Manage weight problems.  Exercise regularly but not excessively.  Get adequate sleep.  Manage stress.  Be aware of changes in your menstrual cycle. Keep a record of when your periods occur. Note the date your period starts, how long it lasts, and any problems. SEEK MEDICAL CARE IF: Your symptoms do not get better with treatment. Document Released: 12/08/2006 Document Revised: 06/29/2013 Document Reviewed: 04/14/2013 [facility] Patient Information 2015 [place], [place]. This information is not intended to replace advice given to you by your health care provider. Make sure you discuss any questions you have with your health care provider. Pelvic Pain, Female Female pelvic pain can be caused by many different things and start from a variety of places. Pelvic pain refers to pain that is located in the lower half of the abdomen and between your hips. The pain may occur over a short period of time (acute) or may be reoccurring (chronic). The cause of pelvic pain may be related to disorders affecting the female reproductive organs (gynecologic), but it may also be related to the bladder, kidney stones, an intestinal complication, or muscle or skeletal problems. Getting help right away for pelvic pain is important, especially if there has been severe, sharp, or a sudden onset of unusual pain. It is also important to get help right away because some types of pelvic pain can be life threatening.  CAUSES  Below are only some of the causes of pelvic pain. The causes of pelvic pain can be in one of several categories.   Gynecologic.  Pelvic inflammatory disease.  Sexually transmitted infection.  Ovarian cyst or a twisted ovarian ligament (ovarian torsion).  Uterine lining that grows outside the uterus (endometriosis).  Fibroids, cysts, or tumors.  Ovulation.  Pregnancy.  Pregnancy that occurs outside the uterus (ectopic  pregnancy).  Miscarriage.  Labor.  Abruption of the placenta or ruptured uterus.  Infection.  Uterine infection (endometritis).  Bladder infection.  Diverticulitis.  Miscarriage related to a uterine infection (septic abortion).  Bladder.  Inflammation of the bladder (cystitis).  Kidney stone(s).  Gastrointenstinal.  Constipation.  Diverticulitis.  Neurologic.  Trauma.  Feeling pelvic pain because of mental or emotional causes (psychosomatic).  Cancers of the bowel or pelvis. EVALUATION  Your caregiver will want to take a careful history of your concerns. This includes recent changes in your health, a careful gynecologic history of your periods (menses), and a sexual history. Obtaining your family history and medical history is also important. Your caregiver may suggest a pelvic exam. A pelvic exam will help identify the location and severity of the pain. It also helps in the evaluation of which organ system may be involved. In order to identify the cause of the pelvic pain and be properly treated, your caregiver may order tests. These tests may include:   A pregnancy test.  Pelvic ultrasonography.  An X-ray exam of the abdomen.  A urinalysis or evaluation of vaginal discharge.  Blood tests. HOME CARE INSTRUCTIONS   Only take over-the-counter or prescription medicines for pain, discomfort, or fever as directed by your caregiver.   Rest as directed by your caregiver.   Eat a balanced diet.   Drink enough fluids to make your urine clear or pale yellow, or as directed.   Avoid sexual intercourse if it causes pain.   Apply warm or cold compresses to the lower abdomen depending on which one helps the pain.   Avoid stressful situations.   Keep a journal of your pelvic pain. Write down when it started, where the pain is located, and if there are things that seem to be associated with the pain, such as food or your menstrual cycle.  Follow up with your  caregiver as directed.  SEEK MEDICAL CARE IF:  Your medicine does not help your pain.  You have abnormal vaginal discharge. SEEK IMMEDIATE MEDICAL CARE IF:   You have heavy bleeding from the vagina.   Your pelvic pain increases.   You feel lightheaded or faint.   You have chills.   You have pain with urination or blood in your urine.   You have uncontrolled diarrhea or vomiting.   You have a fever or persistent symptoms for more than 3 days.  You have a fever and your symptoms suddenly get worse.   You are being physically or sexually abused.  MAKE SURE YOU:  Understand these instructions.  Will watch your condition.  Will get help if you are not doing well or get worse. Document Released: 09/23/2004 Document Revised: 04/27/2012 Document Reviewed: 02/16/2012 [facility] Patient Information 2015 [place], [place]. This information is not intended to replace advice given to you by your health care provider. Make sure you discuss any questions you have with your health care provider.
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
## 2014-05-28 NOTE — MAU Provider Note (Signed)
Attestation of Attending Supervision of Advanced Practitioner (CNM/NP): Evaluation and management procedures were performed by the Advanced Practitioner under my supervision and collaboration.  I have reviewed the Advanced Practitioner's note and chart, and I agree with the management and plan.  Maddyson Keil 05/28/2014 10:21 AM   

## 2014-09-11 ENCOUNTER — Encounter (HOSPITAL_COMMUNITY): Payer: Self-pay | Admitting: *Deleted

## 2015-05-27 IMAGING — US US PELVIS COMPLETE
1 series · 14 of 25 positions shown · non-contrast
Comparison: None.

CLINICAL DATA: Right lower quadrant pain.

EXAM:
TRANSABDOMINAL ULTRASOUND OF PELVIS
TECHNIQUE: Transabdominal ultrasound examination of the pelvis was performed
including evaluation of the uterus, ovaries, adnexal regions, and
pelvic cul-de-sac.

[Series 1: us pelvis complete · 14 of 63 slices shown]
[im 1/63]
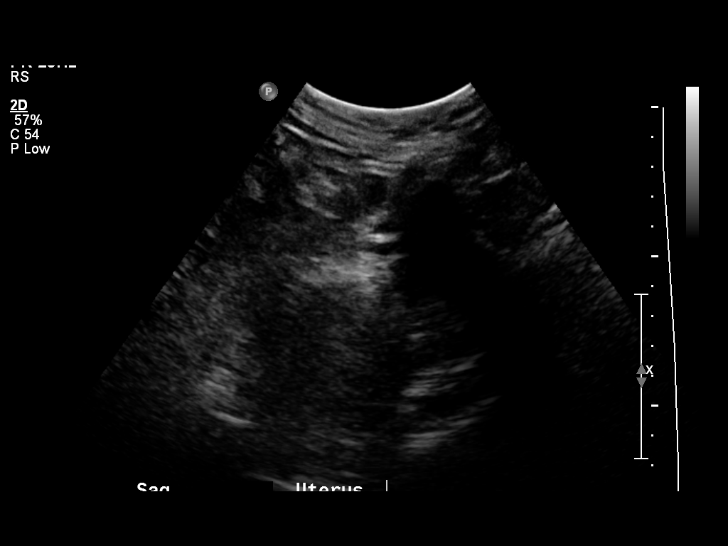
[im 6/63]
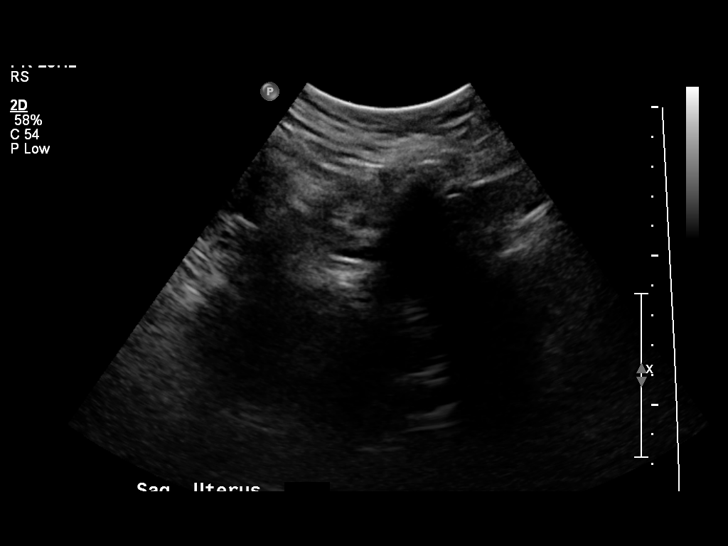
[im 11/63]
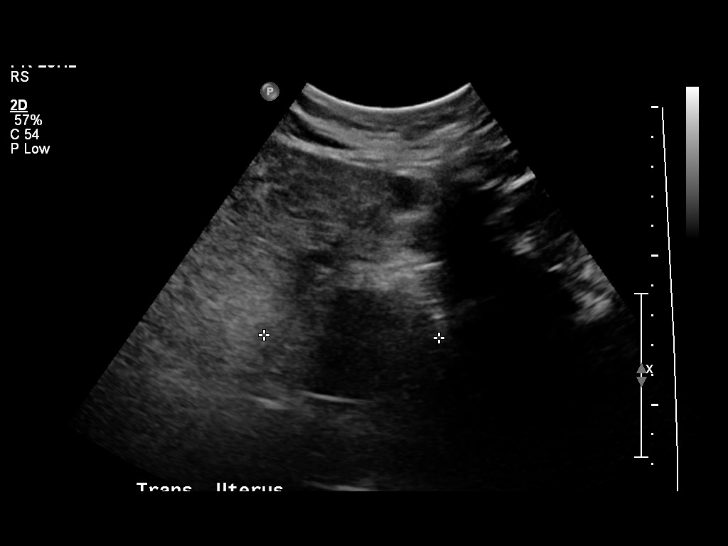
[im 16/63]
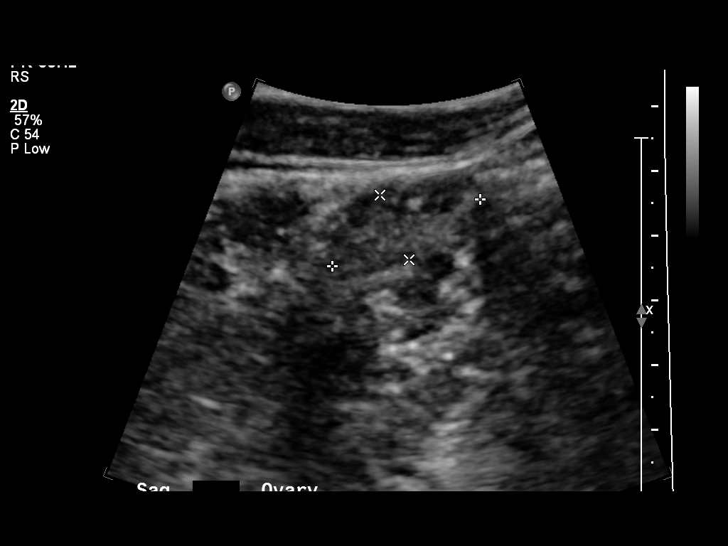
[im 21/63]
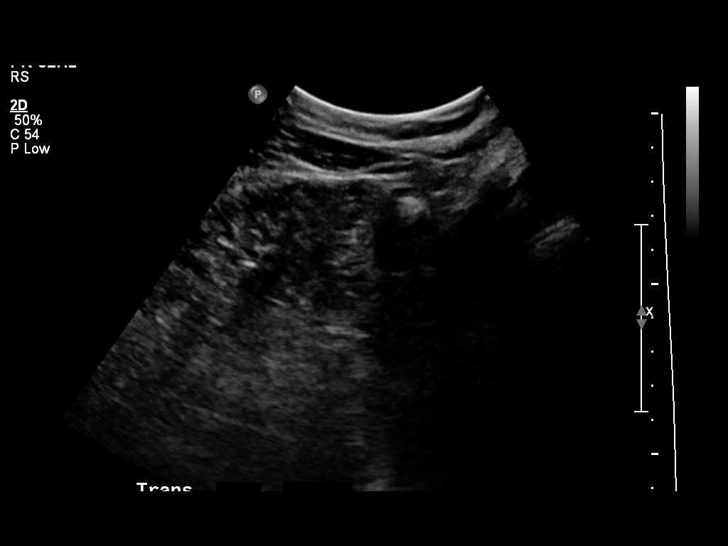
[im 24/63]
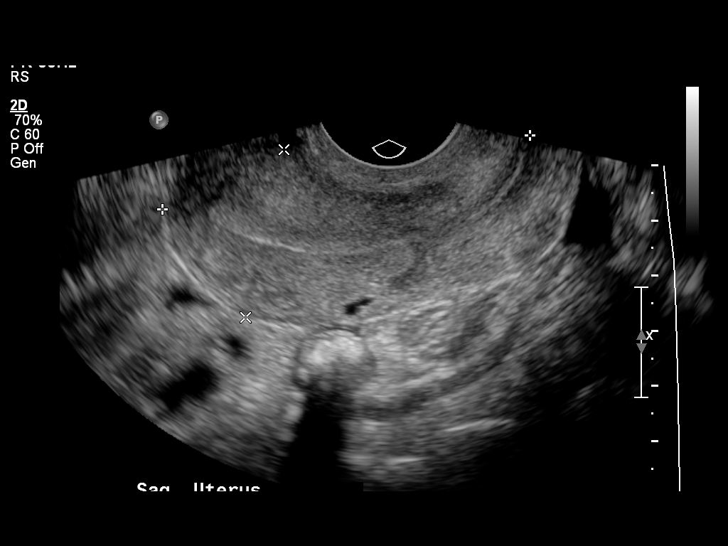
[im 29/63]
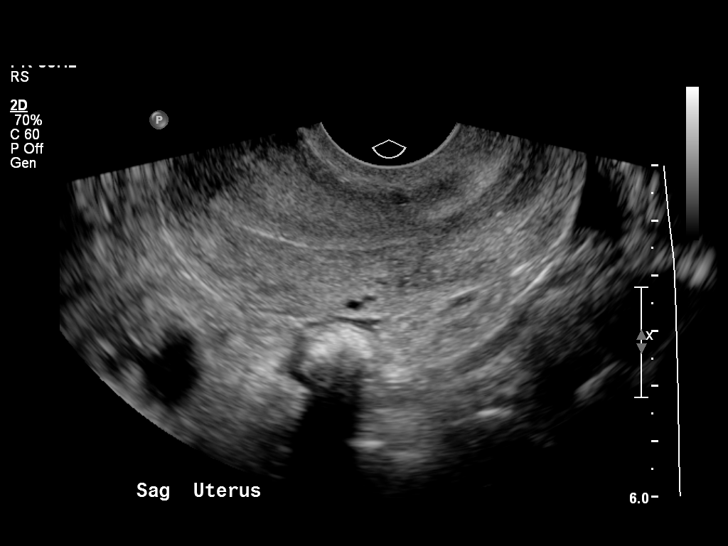
[im 34/63]
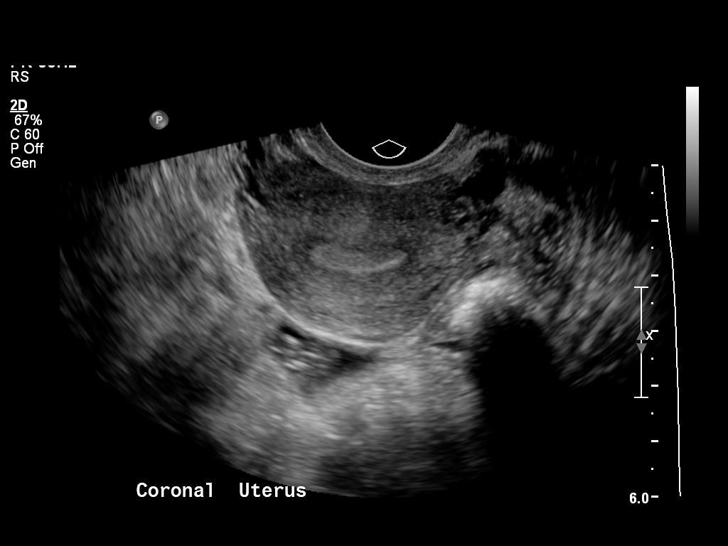
[im 39/63]
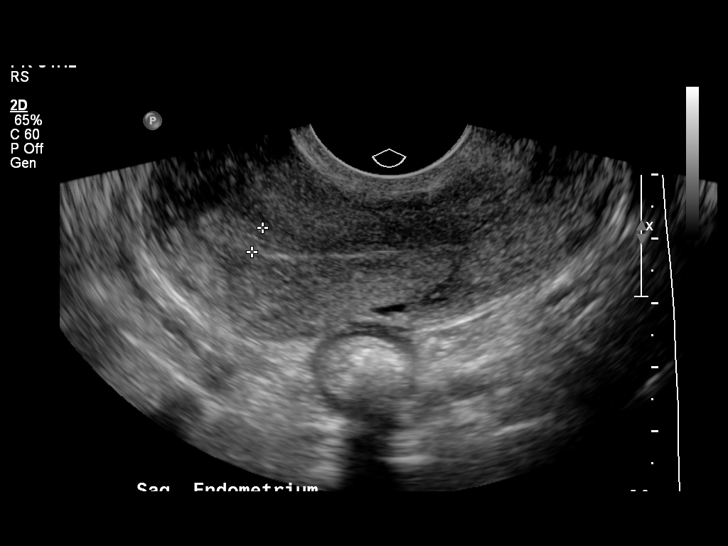
[im 42/63]
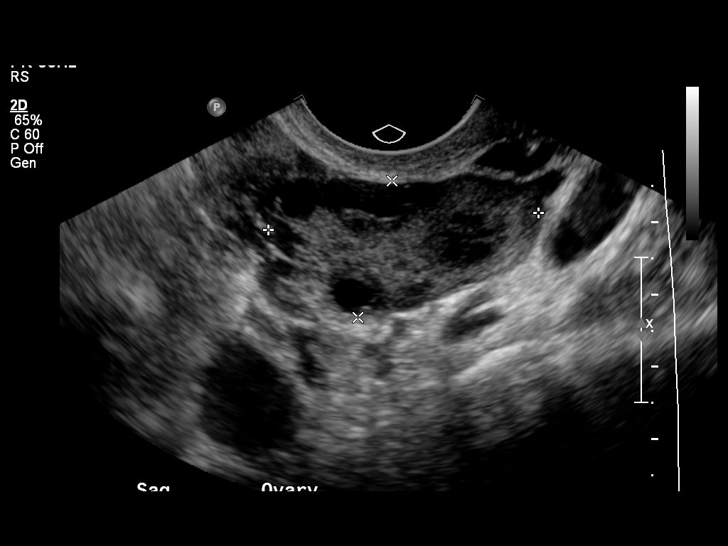
[im 47/63]
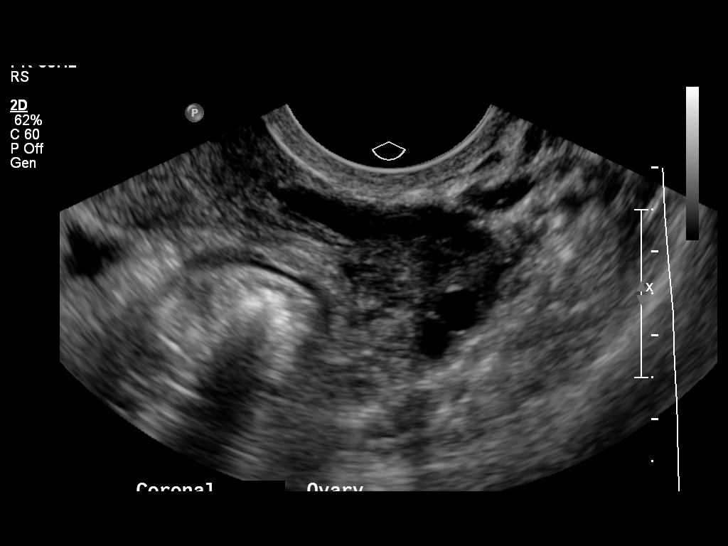
[im 52/63]
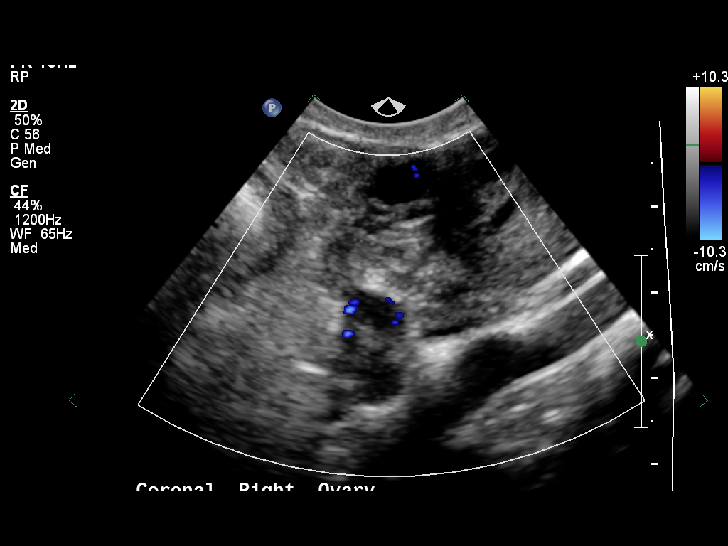
[im 57/63]
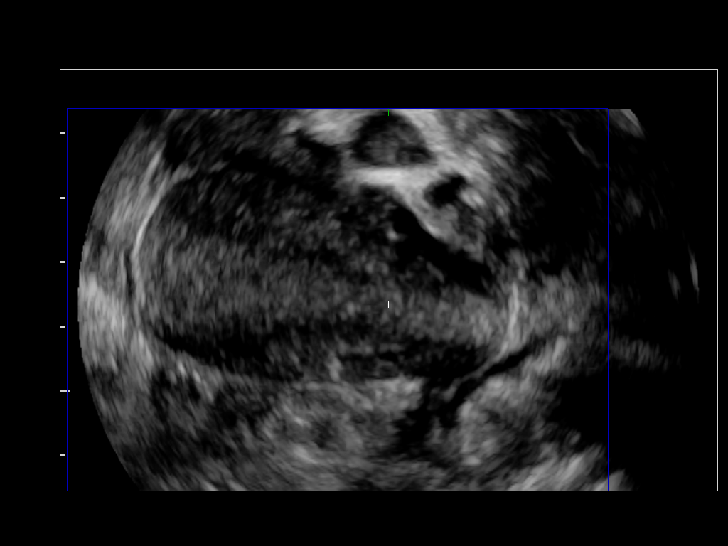
[im 63/63]
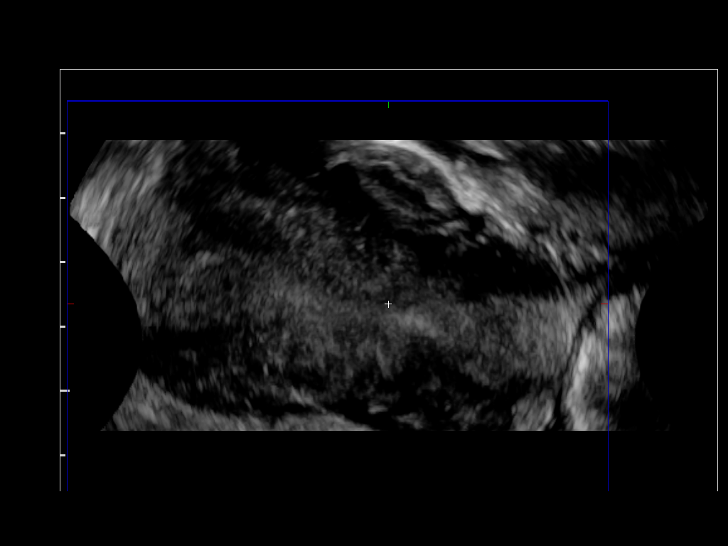

[14 of 25 positions shown; findings below may reference images not displayed]

FINDINGS: Uterus

Measurements: 6.8 x 3.1 x 4.3 cm. No fibroids or other mass
visualized.

Endometrium

Thickness: 4.1 mm.  No focal abnormality visualized.

Right ovary

Measurements: 1.9 x 1.3 x 1.0 cm. Normal appearance/no adnexal mass.

Left ovary

Measurements: 3.7 x 1.9 x 2.4 cm. Normal appearance/no adnexal mass.

Other findings:  No free fluid
IMPRESSION: Normal pelvic ultrasound.  No acute findings.

## 2016-05-21 ENCOUNTER — Encounter (HOSPITAL_COMMUNITY): Payer: Self-pay | Admitting: Emergency Medicine

## 2016-05-21 DIAGNOSIS — X500XXA Overexertion from strenuous movement or load, initial encounter: Secondary | ICD-10-CM | POA: Insufficient documentation

## 2016-05-21 DIAGNOSIS — Y990 Civilian activity done for income or pay: Secondary | ICD-10-CM | POA: Insufficient documentation

## 2016-05-21 DIAGNOSIS — M545 Low back pain: Secondary | ICD-10-CM | POA: Insufficient documentation

## 2016-05-21 DIAGNOSIS — F17200 Nicotine dependence, unspecified, uncomplicated: Secondary | ICD-10-CM | POA: Insufficient documentation

## 2016-05-21 DIAGNOSIS — Y929 Unspecified place or not applicable: Secondary | ICD-10-CM | POA: Insufficient documentation

## 2016-05-21 DIAGNOSIS — Z5321 Procedure and treatment not carried out due to patient leaving prior to being seen by health care provider: Secondary | ICD-10-CM | POA: Insufficient documentation

## 2016-05-21 DIAGNOSIS — Y939 Activity, unspecified: Secondary | ICD-10-CM | POA: Insufficient documentation

## 2016-05-21 NOTE — ED Notes (Signed)
Pt. reports low back pain onset yesterday injured at work while lifting heavy boxes , pain worse when sitting / changing positions .

## 2016-05-22 ENCOUNTER — Emergency Department (HOSPITAL_COMMUNITY)
Admission: EM | Admit: 2016-05-22 | Discharge: 2016-05-22 | Disposition: A | Discharge status: Home / Self Care | Payer: Self-pay | Attending: Emergency Medicine | Admitting: Emergency Medicine

## 2016-05-22 NOTE — ED Notes (Signed)
Called Pt name X3, no response.

## 2018-06-23 ENCOUNTER — Encounter (HOSPITAL_COMMUNITY): Payer: Self-pay | Admitting: Emergency Medicine

## 2018-06-23 ENCOUNTER — Emergency Department (HOSPITAL_COMMUNITY): Payer: Self-pay

## 2018-06-23 ENCOUNTER — Other Ambulatory Visit: Payer: Self-pay

## 2018-06-23 ENCOUNTER — Emergency Department (HOSPITAL_COMMUNITY)
Admission: EM | Admit: 2018-06-23 | Discharge: 2018-06-23 | Disposition: A | Discharge status: Home / Self Care | Payer: Self-pay | Attending: Emergency Medicine | Admitting: Emergency Medicine

## 2018-06-23 DIAGNOSIS — Z23 Encounter for immunization: Secondary | ICD-10-CM | POA: Insufficient documentation

## 2018-06-23 DIAGNOSIS — S0232XA Fracture of orbital floor, left side, initial encounter for closed fracture: Secondary | ICD-10-CM | POA: Insufficient documentation

## 2018-06-23 DIAGNOSIS — Y929 Unspecified place or not applicable: Secondary | ICD-10-CM | POA: Insufficient documentation

## 2018-06-23 DIAGNOSIS — Y999 Unspecified external cause status: Secondary | ICD-10-CM | POA: Insufficient documentation

## 2018-06-23 DIAGNOSIS — Y939 Activity, unspecified: Secondary | ICD-10-CM | POA: Insufficient documentation

## 2018-06-23 DIAGNOSIS — S0285XA Fracture of orbit, unspecified, initial encounter for closed fracture: Secondary | ICD-10-CM

## 2018-06-23 DIAGNOSIS — M542 Cervicalgia: Secondary | ICD-10-CM | POA: Insufficient documentation

## 2018-06-23 DIAGNOSIS — F17210 Nicotine dependence, cigarettes, uncomplicated: Secondary | ICD-10-CM | POA: Insufficient documentation

## 2018-06-23 LAB — I-STAT BETA HCG BLOOD, ED (MC, WL, AP ONLY): I-stat hCG, quantitative: <5 m[IU]/mL (ref <5)

## 2018-06-23 MED ORDER — TETRACAINE HCL 0.5 % OP SOLN
2.0000 [drp] | Freq: Once | OPHTHALMIC | Status: AC
  Administered 2018-06-23: 2 [drp] via OPHTHALMIC
  Filled 2018-06-23: qty 4

## 2018-06-23 MED ORDER — DEXAMETHASONE SODIUM PHOSPHATE 10 MG/ML IJ SOLN
10.0000 mg | Freq: Once | INTRAMUSCULAR | Status: AC
  Administered 2018-06-23: 10 mg via INTRAMUSCULAR
  Filled 2018-06-23: qty 1

## 2018-06-23 MED ORDER — TETANUS-DIPHTH-ACELL PERTUSSIS 5-2.5-18.5 LF-MCG/0.5 IM SUSP
0.5000 mL | Freq: Once | INTRAMUSCULAR | Status: AC
  Administered 2018-06-23: 0.5 mL via INTRAMUSCULAR
  Filled 2018-06-23: qty 0.5

## 2018-06-23 MED ORDER — FLUORESCEIN SODIUM 1 MG OP STRP
1.0000 | ORAL_STRIP | Freq: Once | OPHTHALMIC | Status: AC
  Administered 2018-06-23: 1 via OPHTHALMIC
  Filled 2018-06-23: qty 1

## 2018-06-23 MED ORDER — FENTANYL CITRATE (PF) 100 MCG/2ML IJ SOLN
50.0000 ug | Freq: Once | INTRAMUSCULAR | Status: AC
  Administered 2018-06-23: 50 ug via INTRAMUSCULAR
  Filled 2018-06-23: qty 2

## 2018-06-23 MED ORDER — AMOXICILLIN-POT CLAVULANATE 875-125 MG PO TABS: 1.0000 | ORAL_TABLET | Freq: Two times a day (BID) | ORAL | 0 refills | Status: AC

## 2018-06-23 NOTE — ED Notes (Signed)
edp at bedside  
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
## 2018-06-23 NOTE — Discharge Instructions (Addendum)
Please follow-up with the specialists that are listed on your discharge paperwork.  You were also given an ambulatory referral to neurology due to the findings on the CT scan of your brain.  The neurology office will call you to set up an appointment.  With regard to the other specialist, you will need to call the office to schedule an appointment for follow-up.  Please take the antibiotic that you were given a prescription for fully.   Please return to the emergency department for any severe headaches, numbness or weakness to your arms or legs, inability to move your IN all directions, changes in vision, or any new or worsening symptoms in the meantime.

## 2018-06-29 ENCOUNTER — Ambulatory Visit: Payer: Self-pay | Admitting: Diagnostic Neuroimaging

## 2018-08-18 ENCOUNTER — Telehealth: Payer: Self-pay | Admitting: *Deleted

## 2018-08-18 ENCOUNTER — Ambulatory Visit: Payer: Self-pay | Admitting: Neurology

## 2018-08-18 ENCOUNTER — Encounter

## 2018-08-18 NOTE — Telephone Encounter (Signed)
No showed new patient appointment. 

## 2018-08-19 ENCOUNTER — Encounter: Payer: Self-pay | Admitting: Neurology

## 2021-12-16 ENCOUNTER — Other Ambulatory Visit: Payer: Self-pay

## 2021-12-16 ENCOUNTER — Other Ambulatory Visit (HOSPITAL_BASED_OUTPATIENT_CLINIC_OR_DEPARTMENT_OTHER): Payer: Self-pay

## 2021-12-16 ENCOUNTER — Emergency Department (HOSPITAL_BASED_OUTPATIENT_CLINIC_OR_DEPARTMENT_OTHER)
Admission: EM | Admit: 2021-12-16 | Discharge: 2021-12-16 | Disposition: A | Discharge status: Home / Self Care | Payer: Self-pay | Attending: Emergency Medicine | Admitting: Emergency Medicine

## 2021-12-16 ENCOUNTER — Encounter (HOSPITAL_BASED_OUTPATIENT_CLINIC_OR_DEPARTMENT_OTHER): Payer: Self-pay | Admitting: *Deleted

## 2021-12-16 DIAGNOSIS — J039 Acute tonsillitis, unspecified: Secondary | ICD-10-CM | POA: Insufficient documentation

## 2021-12-16 DIAGNOSIS — Z20822 Contact with and (suspected) exposure to covid-19: Secondary | ICD-10-CM | POA: Insufficient documentation

## 2021-12-16 LAB — RESP PANEL BY RT-PCR (FLU A&B, COVID) ARPGX2
Influenza A by PCR: NEGATIVE
Influenza B by PCR: NEGATIVE
SARS Coronavirus 2 by RT PCR: NEGATIVE

## 2021-12-16 LAB — GROUP A STREP BY PCR: Group A Strep by PCR: NOT DETECTED

## 2021-12-16 MED ORDER — AMOXICILLIN 500 MG PO CAPS
500.0000 mg | ORAL_CAPSULE | Freq: Two times a day (BID) | ORAL | 0 refills | Status: AC
  Filled 2021-12-16: qty 20, 10d supply, fill #0

## 2021-12-16 MED ORDER — LIDOCAINE VISCOUS HCL 2 % MT SOLN
15.0000 mL | Freq: Once | OROMUCOSAL | Status: AC
  Administered 2021-12-16: 15 mL via OROMUCOSAL
  Filled 2021-12-16: qty 15

## 2021-12-16 MED ORDER — LIDOCAINE VISCOUS HCL 2 % MT SOLN
15.0000 mL | OROMUCOSAL | 0 refills | Status: AC | PRN
  Filled 2021-12-16: qty 150, 5d supply, fill #0

## 2021-12-16 MED ORDER — AMOXICILLIN 500 MG PO CAPS
500.0000 mg | ORAL_CAPSULE | Freq: Once | ORAL | Status: AC
  Administered 2021-12-16: 500 mg via ORAL
  Filled 2021-12-16: qty 1

## 2021-12-16 NOTE — ED Provider Notes (Signed)
[facility] EMERGENCY DEPARTMENT Provider Note   CSN: [id number] Arrival date & time: 12/16/21  1250     History  Chief Complaint  Patient presents with   Sore Throat    Courtney Welch is a 46 y.o. female here for evaluation of sore throat as well as some intermittent left ear pain.  Initially started as some congestion, rhinorrhea, sore throat and bilateral ear pain 2 and half weeks ago.  Symptoms resolved.  Few days later developed sore throat.  She is able to eat however states it is painful to eat.  No drooling, dysphagia or trismus.  Her left ear still feels like there is fluid in her denies any tinnitus.  No meds PTA.  No neck pain, neck stiffness, sensation of throat closing.  No fever, cough, rhinorrhea, chest pain, shortness of breath, back pain.  Not currently followed by ENT.  No history of seasonal allergies. Denies concern for oral STD.  HPI     Home Medications Prior to Admission medications   Medication Sig Start Date End Date Taking? Authorizing Provider  amoxicillin (AMOXIL) 500 MG capsule Take 1 capsule (500 mg total) by mouth 2 (two) times daily for 10 days. 12/16/21 12/26/21 Yes Vivaan Helseth A, PA-C  lidocaine (XYLOCAINE) 2 % solution Use as directed 15 mLs twice a day in the mouth or throat as needed for mouth pain. 12/16/21  Yes Yvaine Jankowiak A, PA-C      Allergies    Patient has no known allergies.    Review of Systems   Review of Systems  Constitutional: Negative.   HENT:  Positive for ear pain and sore throat. Negative for congestion, ear discharge, facial swelling, postnasal drip, rhinorrhea, sinus pressure, sneezing, tinnitus, trouble swallowing and voice change.   Respiratory: Negative.    Cardiovascular: Negative.   Gastrointestinal: Negative.   Genitourinary: Negative.   Musculoskeletal: Negative.   Skin: Negative.   Neurological: Negative.   All other systems reviewed and are negative.  Physical Exam Updated Vital Signs BP 114/80  (BP Location: Right Arm)    Pulse 86    Temp 98 F (36.7 C) (Oral)    Resp 20    Ht 5\' 6"  (1.676 m)    Wt 92.1 kg    LMP 11/30/2021    SpO2 100%    BMI 32.77 kg/m  Physical Exam Vitals and nursing note reviewed.  Constitutional:      General: She is not in acute distress.    Appearance: She is well-developed. She is not ill-appearing, toxic-appearing or diaphoretic.  HENT:     Head: Atraumatic.     Jaw: There is normal jaw occlusion.     Right Ear: Tympanic membrane, ear canal and external ear normal. No mastoid tenderness. No PE tube.     Left Ear: No drainage, swelling or tenderness. No mastoid tenderness. No PE tube. No hemotympanum. Tympanic membrane is not perforated, erythematous or bulging.     Ears:     Comments: Right ear mild cerumen however no impaction Left ear with minimal cerumen, TM visualized with some mild posterior fluid however no erythema, no drainage, middle ear effusion    Nose: Nose normal.     Right Sinus: No maxillary sinus tenderness or frontal sinus tenderness.     Left Sinus: No maxillary sinus tenderness or frontal sinus tenderness.     Mouth/Throat:     Lips: Pink.     Mouth: Mucous membranes are moist.     Palate:  No mass and lesions.     Pharynx: Uvula midline. Posterior oropharyngeal erythema and uvula swelling present.     Tonsils: Tonsillar exudate present. No tonsillar abscesses. 2+ on the right. 2+ on the left.     Comments: Posterior oropharynx with some mild erythema.  Tonsils 2+ bilaterally with exudate, uvula with minimal swelling, no deviation.  No evidence of PTA or RPA.  No pooling of secretions Eyes:     Pupils: Pupils are equal, round, and reactive to light.  Neck:     Trachea: Trachea and phonation normal.     Comments: No neck stiffness or neck rigidity.  No meningismus Cardiovascular:     Rate and Rhythm: Normal rate.     Pulses: Normal pulses.     Heart sounds: Normal heart sounds.  Pulmonary:     Effort: No respiratory distress.   Abdominal:     General: There is no distension.  Musculoskeletal:        General: Normal range of motion.     Cervical back: Full passive range of motion without pain and normal range of motion.  Lymphadenopathy:     Cervical: No cervical adenopathy.  Skin:    General: Skin is warm and dry.  Neurological:     General: No focal deficit present.     Mental Status: She is alert.  Psychiatric:        Mood and Affect: Mood normal.   ED Results / Procedures / Treatments   Labs (all labs ordered are listed, but only abnormal results are displayed) Labs Reviewed  RESP PANEL BY RT-PCR (FLU A&B, COVID) ARPGX2  GROUP A STREP BY PCR    EKG None  Radiology No results found.  Procedures Procedures    Medications Ordered in ED Medications  lidocaine (XYLOCAINE) 2 % viscous mouth solution 15 mL (15 mLs Mouth/Throat Given 12/16/21 1549)  amoxicillin (AMOXIL) capsule 500 mg (500 mg Oral Given 12/16/21 1549)   ED Course/ Medical Decision Making/ A&P    Pleasant 46 year old here for evaluation of greater than 2 weeks of sore throat.  She is afebrile, nonseptic, not ill-appearing.  No hot potato voice.  No pooling of secretions.  No facial swelling.  No neck stiffness or neck rigidity.  No meningismus. Tolerating PO intake without difficulty. Right ear clear, left ear with some mild posterior TM fluid however no bulging, erythema.  Posterior oropharynx with some mild erythema, tonsils 2+ bilaterally with exudate, uvula with some mild swelling however no deviation.  No evidence of PTA or RPA.  Low suspicion for epiglottitis, deep space infection.  She is tolerating her secretions here as well as tolerating p.o. intake.  Suspect tonsillitis.  We will treat as touch.  We will have her follow-up with ENT if symptoms do not improve.  Do not feel she needs CT scan, labs or IV abx.  She denies any concerns for intraoral STD.  Labs personally reviewed and interpreted:  COVID, flu negative Strep  negative  Discussed plan with patient.  She is agreeable for outpatient follow-up.  The patient has been appropriately medically screened and/or stabilized in the ED. I have low suspicion for any other emergent medical condition which would require further screening, evaluation or treatment in the ED or require inpatient management.  Patient is hemodynamically stable and in no acute distress.  Patient able to ambulate in department prior to ED.  Evaluation does not show acute pathology that would require ongoing or additional emergent interventions while in the  emergency department or further inpatient treatment.  I have discussed the diagnosis with the patient and answered all questions.  Pain is been managed while in the emergency department and patient has no further complaints prior to discharge.  Patient is comfortable with plan discussed in room and is stable for discharge at this time.  I have discussed strict return precautions for returning to the emergency department.  Patient was encouraged to follow-up with PCP/specialist refer to at discharge.                            Medical Decision Making Amount and/or Complexity of Data Reviewed Labs: ordered. Decision-making details documented in ED Course.  Risk OTC drugs. Prescription drug management. Diagnosis or treatment significantly limited by social determinants of health. Risk Details: Risk: No ENT FU established  Do not feel patient needs additional labs, imaging, hospitalization at this time          Final Clinical Impression(s) / ED Diagnoses Final diagnoses:  Tonsillitis    Rx / DC Orders ED Discharge Orders          Ordered    amoxicillin (AMOXIL) 500 MG capsule  2 times daily        12/16/21 1552    lidocaine (XYLOCAINE) 2 % solution  As needed        12/16/21 1552              Lendon George A, PA-C 12/16/21 1558    Cheryll Cockayne, MD 12/20/21 1637

## 2021-12-16 NOTE — ED Triage Notes (Signed)
Sore throat x 3 weeks. Left ear feels clogged per pt. Her tongue is sore.

## 2021-12-16 NOTE — Discharge Instructions (Addendum)
Medications as prescribed.  Follow-up with ear nose and throat provider if your symptoms do not improve  Return for new or any worsening symptoms

## 2021-12-16 NOTE — ED Notes (Signed)
Presents with sore throat for the past 3 weeks, pain radiates to left ear as well. Covid/ Flu swab obtained and resulted. Awaiting ED provider evaluation
